# Patient Record
Sex: Male | Born: 1995 | Race: Black or African American | Hispanic: No | Marital: Single | Smoking: Never smoker
Health system: Southern US, Community
[De-identification: ages and names within clinical notes are randomized; demographics above are authoritative.]

## PROBLEM LIST (undated history)

## (undated) HISTORY — PX: OTHER SURGICAL HISTORY: SHX169

---

## 2016-02-25 ENCOUNTER — Emergency Department (HOSPITAL_COMMUNITY): Payer: BC Managed Care – PPO

## 2016-02-25 ENCOUNTER — Encounter (HOSPITAL_COMMUNITY): Payer: Self-pay | Admitting: Emergency Medicine

## 2016-02-25 ENCOUNTER — Emergency Department (HOSPITAL_COMMUNITY)
Admission: EM | Admit: 2016-02-25 | Discharge: 2016-02-25 | Disposition: A | Discharge status: Home / Self Care | Payer: BC Managed Care – PPO | Attending: Emergency Medicine | Admitting: Emergency Medicine

## 2016-02-25 DIAGNOSIS — Y939 Activity, unspecified: Secondary | ICD-10-CM | POA: Insufficient documentation

## 2016-02-25 DIAGNOSIS — S0990XA Unspecified injury of head, initial encounter: Secondary | ICD-10-CM | POA: Diagnosis present

## 2016-02-25 DIAGNOSIS — T07XXXA Unspecified multiple injuries, initial encounter: Secondary | ICD-10-CM

## 2016-02-25 DIAGNOSIS — Z79899 Other long term (current) drug therapy: Secondary | ICD-10-CM | POA: Diagnosis not present

## 2016-02-25 DIAGNOSIS — S0083XA Contusion of other part of head, initial encounter: Secondary | ICD-10-CM | POA: Insufficient documentation

## 2016-02-25 DIAGNOSIS — Y999 Unspecified external cause status: Secondary | ICD-10-CM | POA: Insufficient documentation

## 2016-02-25 DIAGNOSIS — Y92410 Unspecified street and highway as the place of occurrence of the external cause: Secondary | ICD-10-CM | POA: Diagnosis not present

## 2016-02-25 MED ORDER — IBUPROFEN 800 MG PO TABS: 800.0000 mg | ORAL_TABLET | Freq: Three times a day (TID) | ORAL | Status: AC

## 2016-02-25 MED ORDER — METHOCARBAMOL 500 MG PO TABS: 500.0000 mg | ORAL_TABLET | Freq: Three times a day (TID) | ORAL | Status: AC | PRN

## 2016-02-25 MED ORDER — HYDROCODONE-ACETAMINOPHEN 5-325 MG PO TABS: 2.0000 | ORAL_TABLET | ORAL | Status: AC | PRN

## 2016-02-25 MED ORDER — ONDANSETRON 4 MG PO TBDP
4.0000 mg | ORAL_TABLET | Freq: Once | ORAL | Status: AC
  Administered 2016-02-25: 4 mg via ORAL
  Filled 2016-02-25: qty 1

## 2016-02-25 MED ORDER — HYDROCODONE-ACETAMINOPHEN 5-325 MG PO TABS
1.0000 | ORAL_TABLET | Freq: Once | ORAL | Status: AC
  Administered 2016-02-25: 1 via ORAL
  Filled 2016-02-25: qty 1

## 2016-02-25 NOTE — ED Notes (Signed)
Transported to [place] 

## 2016-02-25 NOTE — Discharge Instructions (Signed)
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
## 2016-02-25 NOTE — ED Notes (Signed)
MD at bedside. 
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
## 2018-01-24 ENCOUNTER — Emergency Department (HOSPITAL_COMMUNITY)
Admission: EM | Admit: 2018-01-24 | Discharge: 2018-01-24 | Disposition: A | Discharge status: Home / Self Care | Payer: BC Managed Care – PPO | Attending: Emergency Medicine | Admitting: Emergency Medicine

## 2018-01-24 ENCOUNTER — Other Ambulatory Visit: Payer: Self-pay

## 2018-01-24 ENCOUNTER — Encounter (HOSPITAL_COMMUNITY): Payer: Self-pay | Admitting: Emergency Medicine

## 2018-01-24 DIAGNOSIS — M545 Low back pain: Secondary | ICD-10-CM | POA: Insufficient documentation

## 2018-01-24 DIAGNOSIS — R070 Pain in throat: Secondary | ICD-10-CM | POA: Diagnosis present

## 2018-01-24 DIAGNOSIS — J029 Acute pharyngitis, unspecified: Secondary | ICD-10-CM | POA: Diagnosis not present

## 2018-01-24 DIAGNOSIS — M542 Cervicalgia: Secondary | ICD-10-CM | POA: Insufficient documentation

## 2018-01-24 DIAGNOSIS — R51 Headache: Secondary | ICD-10-CM | POA: Insufficient documentation

## 2018-01-24 LAB — GROUP A STREP BY PCR: GROUP A STREP BY PCR: NOT DETECTED

## 2018-01-24 NOTE — ED Provider Notes (Signed)
[facility] EMERGENCY DEPARTMENT Provider Note   CSN: [id number] Arrival date & time: 01/24/18  1002     History   Chief Complaint Chief Complaint  Patient presents with  . Torticollis  . Back Pain  . Sore Throat  . Headache    HPI Tyler Barber is a 22 y.o. male.  22 year old male who presents with sore throat and neck pain.  The patient reports a 4-day history of sore throat associated with sweats and chills, cough, nasal congestion, diarrhea, and pain on the sides of his neck.  For the past day he has had some headaches and low back pain.  No sick contacts but he does work at a daycare.  He has been taking ibuprofen for his symptoms.  No vomiting.  The history is provided by the patient.  Back Pain   Associated symptoms include headaches.  Sore Throat  Associated symptoms include headaches.  Headache      History reviewed. No pertinent past medical history.  There are no active problems to display for this patient.   Past Surgical History:  Procedure Laterality Date  . spetum repair          Home Medications    Prior to Admission medications   Medication Sig Start Date End Date Taking? Authorizing Provider  HYDROcodone-acetaminophen (NORCO/VICODIN) 5-325 MG tablet Take 2 tablets by mouth every 4 (four) hours as needed. 02/25/16   Rolland Porter, MD  ibuprofen (ADVIL,MOTRIN) 800 MG tablet Take 1 tablet (800 mg total) by mouth 3 (three) times daily. 02/25/16   Rolland Porter, MD  methocarbamol (ROBAXIN) 500 MG tablet Take 1 tablet (500 mg total) by mouth 3 (three) times daily between meals as needed. 02/25/16   Rolland Porter, MD    Family History No family history on file.  Social History Social History   Tobacco Use  . Smoking status: Never Smoker  Substance Use Topics  . Alcohol use: No  . Drug use: Never     Allergies   Patient has no known allergies.   Review of Systems Review of Systems  Musculoskeletal: Positive for back pain.    Neurological: Positive for headaches.   All other systems reviewed and are negative except that which was mentioned in HPI   Physical Exam Updated Vital Signs BP 130/90   Pulse 74   Temp 97.6 F (36.4 C) (Oral)   Resp 16   Ht 6' (1.829 m)   Wt 78 kg (172 lb)   SpO2 100%   BMI 23.33 kg/m   Physical Exam  Constitutional: He is oriented to person, place, and time. He appears well-developed and well-nourished. No distress.  HENT:  Head: Normocephalic and atraumatic.  Mouth/Throat: Uvula is midline and oropharynx is clear and moist. No oropharyngeal exudate or tonsillar abscesses.  Moist mucous membranes  Eyes: Pupils are equal, round, and reactive to light. Conjunctivae are normal.  Neck: Normal range of motion. Neck supple.  Cardiovascular: Normal rate, regular rhythm and normal heart sounds.  No murmur heard. Pulmonary/Chest: Effort normal and breath sounds normal.  Abdominal: Soft. Bowel sounds are normal. He exhibits no distension. There is no tenderness.  Musculoskeletal: He exhibits no edema.  Lymphadenopathy:    He has cervical adenopathy (mild).  Neurological: He is alert and oriented to person, place, and time.  Fluent speech  Skin: Skin is warm and dry. No rash noted.  Psychiatric: He has a normal mood and affect. Judgment normal.  Nursing note and vitals reviewed.  ED Treatments / Results  Labs (all labs ordered are listed, but only abnormal results are displayed) Labs Reviewed  GROUP A STREP BY PCR    EKG None  Radiology No results found.  Procedures Procedures (including critical care time)  Medications Ordered in ED Medications - No data to display   Initial Impression / Assessment and Plan / ED Course  I have reviewed the triage vital signs and the nursing notes.  Pertinent labs that were available during my care of the patient were reviewed by me and considered in my medical decision making (see chart for details).    Patient was  well-appearing on exam with normal vital signs, tolerating secretions, oropharynx was clear.  No evidence of PTA, RPA, or other life-threatening infection.  Strep test is negative.  I suspect viral pharyngitis based on symptoms.  Have discussed supportive measures and extensively reviewed return precautions.  Patient voiced understanding.  Final Clinical Impressions(s) / ED Diagnoses   Final diagnoses:  Viral pharyngitis    ED Discharge Orders    None       Maylea Soria, Ambrose Finland, MD 01/24/18 1303

## 2018-01-24 NOTE — ED Triage Notes (Signed)
Onset 4 days sore throat and stiff neck and one day ago lower back pain and headache. Airway intact bilateral equal chest rise and fall. Alert answering and following commands appropriate,

## 2020-05-23 ENCOUNTER — Other Ambulatory Visit: Payer: BC Managed Care – PPO

## 2020-05-23 DIAGNOSIS — Z20822 Contact with and (suspected) exposure to covid-19: Secondary | ICD-10-CM

## 2020-05-25 LAB — SPECIMEN STATUS REPORT

## 2020-05-25 LAB — SARS-COV-2, NAA 2 DAY TAT

## 2020-05-25 LAB — NOVEL CORONAVIRUS, NAA: SARS-CoV-2, NAA: NOT DETECTED

## 2023-02-17 ENCOUNTER — Other Ambulatory Visit: Payer: Self-pay

## 2023-02-17 ENCOUNTER — Emergency Department (HOSPITAL_COMMUNITY)
Admission: EM | Admit: 2023-02-17 | Discharge: 2023-02-18 | Disposition: A | Discharge status: Home / Self Care | Payer: BLUE CROSS/BLUE SHIELD | Attending: Emergency Medicine | Admitting: Emergency Medicine

## 2023-02-17 DIAGNOSIS — W25XXXA Contact with sharp glass, initial encounter: Secondary | ICD-10-CM | POA: Insufficient documentation

## 2023-02-17 DIAGNOSIS — Y9367 Activity, basketball: Secondary | ICD-10-CM | POA: Insufficient documentation

## 2023-02-17 DIAGNOSIS — S01111A Laceration without foreign body of right eyelid and periocular area, initial encounter: Secondary | ICD-10-CM | POA: Insufficient documentation

## 2023-02-17 DIAGNOSIS — S0993XA Unspecified injury of face, initial encounter: Secondary | ICD-10-CM | POA: Diagnosis present

## 2023-02-17 NOTE — ED Triage Notes (Signed)
Patient presents with approx. 1/2" horizontal right eyelid laceration with swelling injured this evening while playing basketball , no LOC/ambulatory , no vision loss , bleeding controlled .

## 2023-02-18 MED ORDER — LIDOCAINE-EPINEPHRINE (PF) 2 %-1:200000 IJ SOLN
20.0000 mL | Freq: Once | INTRAMUSCULAR | Status: AC
  Administered 2023-02-18: 20 mL
  Filled 2023-02-18: qty 20

## 2023-02-18 MED ORDER — LIDOCAINE-EPINEPHRINE-TETRACAINE (LET) TOPICAL GEL
3.0000 mL | Freq: Once | TOPICAL | Status: AC
  Administered 2023-02-18: 3 mL via TOPICAL
  Filled 2023-02-18: qty 3

## 2023-02-18 NOTE — ED Provider Notes (Signed)
[facility] Emergency Department Provider Note MRN:  [id number]  Arrival date & time: 02/18/23     Chief Complaint   R- Eyelid Laceration    History of Present Illness   Tyler Barber is a 27 y.o. year-old male presents to the ED with chief complaint of right eyelid laceration.  Was playing basketball and his glasses cut his eyebrow.  Denies LOC.  Mother sent him because she thought he'd need stitches.   History provided by patient.   Review of Systems  Pertinent positive and negative review of systems noted in HPI.    Physical Exam   Vitals:   02/18/23 0139 02/18/23 0525  BP: 128/83 119/70  Pulse: 67 (!) 56  Resp: 18 18  Temp: 98 F (36.7 C) 98.1 F (36.7 C)  SpO2: 100% 100%    CONSTITUTIONAL:  well-appearing, NAD NEURO:  Alert and oriented x 3, CN 3-12 grossly intact EYES:  eyes equal and reactive, right lower eyebrow/upper eyelid laceration, not through and through, approx 1.5 cm ENT/NECK:  Supple, no stridor  CARDIO:  normal rate, regular rhythm, appears well-perfused  PULM:  No respiratory distress,  GI/GU:  non-distended,  MSK/SPINE:  No gross deformities, no edema, moves all extremities  SKIN:  no rash, atraumatic   *Additional and/or pertinent findings included in MDM below  Diagnostic and Interventional Summary    EKG Interpretation  Date/Time:    Ventricular Rate:    PR Interval:    QRS Duration:   QT Interval:    QTC Calculation:   R Axis:     Text Interpretation:         Labs Reviewed - No data to display  No orders to display    Medications  lidocaine-EPINEPHrine (XYLOCAINE W/EPI) 2 %-1:200000 (PF) injection 20 mL (has no administration in time range)  lidocaine-EPINEPHrine-tetracaine (LET) topical gel (3 mLs Topical Given 02/18/23 0529)     Procedures  /  Critical Care .Marland KitchenLaceration Repair  Date/Time: 02/18/2023 6:07 AM  Performed by: Roxy Horseman, PA-C Authorized by: Roxy Horseman, PA-C   Consent:     Consent obtained:  Verbal   Consent given by:  Patient   Risks, benefits, and alternatives were discussed: yes     Risks discussed:  Infection, pain, poor cosmetic result and poor wound healing   Alternatives discussed:  No treatment Universal protocol:    Procedure explained and questions answered to patient or proxy's satisfaction: yes     Relevant documents present and verified: yes     Test results available: yes     Imaging studies available: yes     Required blood products, implants, devices, and special equipment available: yes     Site/side marked: yes     Immediately prior to procedure, a time out was called: yes     Patient identity confirmed:  Verbally with patient Anesthesia:    Anesthesia method:  Topical application and local infiltration   Topical anesthetic:  LET   Local anesthetic:  Lidocaine 1% WITH epi Laceration details:    Location:  Face   Face location:  R upper eyelid   Extent:  Superficial   Length (cm):  1.5 Pre-procedure details:    Preparation:  Patient was prepped and draped in usual sterile fashion Exploration:    Contaminated: no   Treatment:    Area cleansed with:  Saline   Amount of cleaning:  Standard   Irrigation solution:  Sterile saline Skin repair:    Repair method:  Sutures   Suture size:  5-0   Wound skin closure material used: vicryl rapide.   Suture technique:  Simple interrupted   Number of sutures:  4 Approximation:    Approximation:  Close Repair type:    Repair type:  Simple Post-procedure details:    Dressing:  Antibiotic ointment and adhesive bandage   Procedure completion:  Tolerated well, no immediate complications   ED Course and Medical Decision Making  I have reviewed the triage vital signs, the nursing notes, and pertinent available records from the EMR.  Social Determinants Affecting Complexity of Care: Patient has no clinically significant social determinants affecting this chief complaint..   ED Course:     Medical Decision Making Risk Prescription drug management.         Consultants: No consultations were needed in caring for this patient.   Treatment and Plan: Emergency department workup does not suggest an emergent condition requiring admission or immediate intervention beyond  what has been performed at this time. The patient is safe for discharge and has  been instructed to return immediately for worsening symptoms, change in  symptoms or any other concerns    Final Clinical Impressions(s) / ED Diagnoses     ICD-10-CM   1. Right eyelid laceration, initial encounter  Z61.096E       ED Discharge Orders     None         Discharge Instructions Discussed with and Provided to Patient:   Discharge Instructions   None      Roxy Horseman, PA-C 02/18/23 4540    Mesner, Barbara Cower, MD 02/18/23 [phone]

## 2024-03-07 ENCOUNTER — Ambulatory Visit
Admission: RE | Admit: 2024-03-07 | Discharge: 2024-03-07 | Disposition: A | Discharge status: Home / Self Care | Source: Ambulatory Visit | Attending: Family Medicine | Admitting: Family Medicine

## 2024-03-07 VITALS — BP 131/85 | HR 94 | Temp 99.2°F | Resp 16

## 2024-03-07 DIAGNOSIS — N342 Other urethritis: Secondary | ICD-10-CM | POA: Diagnosis present

## 2024-03-07 DIAGNOSIS — Z113 Encounter for screening for infections with a predominantly sexual mode of transmission: Secondary | ICD-10-CM | POA: Insufficient documentation

## 2024-03-07 MED ORDER — DOXYCYCLINE HYCLATE 100 MG PO CAPS: 100.0000 mg | ORAL_CAPSULE | Freq: Two times a day (BID) | ORAL | 0 refills | Status: AC

## 2024-03-07 MED ORDER — CEFTRIAXONE SODIUM 500 MG IJ SOLR
500.0000 mg | INTRAMUSCULAR | Status: DC
  Administered 2024-03-07: 500 mg via INTRAMUSCULAR

## 2024-03-07 NOTE — Discharge Instructions (Addendum)
 Avoid all forms of sexual intercourse (oral, vaginal, anal) for the next 7 days to avoid spreading/reinfecting or at least until we can see what kinds of infection results are positive.  Abstaining for 2 weeks would be better but at least 1 week is required.  We will let you know about your test results from the swab we did today and if you need any prescriptions for antibiotics or changes to your treatment from today.

## 2024-03-07 NOTE — ED Provider Notes (Signed)
  [facility]  Note:  This document was prepared using Conservation officer, historic buildings and may include unintentional dictation errors.  MRN: [id number] DOB: 1995-09-27  Subjective:   Tyler Barber is a 28 y.o. male presenting for 1 week history of persistent dysuria, penile discharge, testicle pain, pelvic pain. Symptoms started after he had unprotected sex. Would like STI treatment.   No current facility-administered medications for this encounter.  Current Outpatient Medications:    HYDROcodone -acetaminophen  (NORCO/VICODIN) 5-325 MG tablet, Take 2 tablets by mouth every 4 (four) hours as needed., Disp: 10 tablet, Rfl: 0   ibuprofen  (ADVIL ,MOTRIN ) 800 MG tablet, Take 1 tablet (800 mg total) by mouth 3 (three) times daily., Disp: 21 tablet, Rfl: 0   methocarbamol  (ROBAXIN ) 500 MG tablet, Take 1 tablet (500 mg total) by mouth 3 (three) times daily between meals as needed., Disp: 20 tablet, Rfl: 0   No Known Allergies  History reviewed. No pertinent past medical history.   Past Surgical History:  Procedure Laterality Date   spetum repair      No family history on file.  Social History   Tobacco Use   Smoking status: Never   Smokeless tobacco: Never  Vaping Use   Vaping status: Every Day  Substance Use Topics   Alcohol use: No   Drug use: Never    ROS   Objective:   Vitals: BP 131/85 (BP Location: Right Arm)   Pulse 94   Temp 99.2 F (37.3 C) (Oral)   Resp 16   SpO2 98%   Physical Exam Constitutional:      General: He is not in acute distress.    Appearance: Normal appearance. He is well-developed and normal weight. He is not ill-appearing, toxic-appearing or diaphoretic.  HENT:     Head: Normocephalic and atraumatic.     Right Ear: External ear normal.     Left Ear: External ear normal.     Nose: Nose normal.     Mouth/Throat:     Pharynx: Oropharynx is clear.  Eyes:     General: No scleral icterus.       Right eye: No discharge.         Left eye: No discharge.     Extraocular Movements: Extraocular movements intact.  Cardiovascular:     Rate and Rhythm: Normal rate.  Pulmonary:     Effort: Pulmonary effort is normal.  Genitourinary:    Penis: Circumcised. Discharge present. No phimosis, paraphimosis, hypospadias, erythema, tenderness, swelling or lesions.   Musculoskeletal:     Cervical back: Normal range of motion.  Neurological:     Mental Status: He is alert and oriented to person, place, and time.  Psychiatric:        Mood and Affect: Mood normal.        Behavior: Behavior normal.        Thought Content: Thought content normal.        Judgment: Judgment normal.    IM ceftriaxone  500mg  administered in clinic.   Assessment and Plan :   PDMP not reviewed this encounter.  1. Urethritis    Patient treated empirically as per CDC guidelines with IM ceftriaxone , doxycycline  as an outpatient.  Labs pending.   Counseled on safe sex practices including abstaining for 1 week following treatment.  Counseled patient on potential for adverse effects with medications prescribed/recommended today, ER and return-to-clinic precautions discussed, patient verbalized understanding.    Christopher Savannah, [place] 03/07/24 1113

## 2024-03-07 NOTE — ED Triage Notes (Signed)
 Pt c/o penile d/c, dysuria, testicle tenderness and prostate pain, chills- sx x 1 week-NAD-steady gait

## 2024-03-08 ENCOUNTER — Ambulatory Visit

## 2024-03-08 LAB — RPR: RPR Ser Ql: NONREACTIVE

## 2024-03-08 LAB — HIV ANTIBODY (ROUTINE TESTING W REFLEX): HIV Screen 4th Generation wRfx: NONREACTIVE

## 2024-03-12 ENCOUNTER — Ambulatory Visit (HOSPITAL_COMMUNITY): Payer: Self-pay

## 2024-03-12 LAB — CYTOLOGY, (ORAL, ANAL, URETHRAL) ANCILLARY ONLY
Chlamydia: NEGATIVE
Comment: NEGATIVE
Comment: NEGATIVE
Comment: NORMAL
Neisseria Gonorrhea: POSITIVE — AB
Trichomonas: NEGATIVE
